# Patient Record
Sex: Female | Born: 1985 | Race: White | Hispanic: No | Marital: Single | Smoking: Former smoker
Health system: Southern US, Community
[De-identification: ages and names within clinical notes are randomized; demographics above are authoritative.]

## PROBLEM LIST (undated history)

## (undated) DIAGNOSIS — I429 Cardiomyopathy, unspecified: Secondary | ICD-10-CM

## (undated) DIAGNOSIS — I10 Essential (primary) hypertension: Secondary | ICD-10-CM

## (undated) HISTORY — PX: OTHER SURGICAL HISTORY: SHX169

## (undated) HISTORY — PX: CARDIAC ELECTROPHYSIOLOGY MAPPING AND ABLATION: SHX1292

---

## 2016-06-11 ENCOUNTER — Other Ambulatory Visit (HOSPITAL_COMMUNITY): Payer: Self-pay | Admitting: Obstetrics and Gynecology

## 2016-06-11 DIAGNOSIS — Z3689 Encounter for other specified antenatal screening: Secondary | ICD-10-CM

## 2016-06-11 DIAGNOSIS — Z3A25 25 weeks gestation of pregnancy: Secondary | ICD-10-CM

## 2016-06-19 ENCOUNTER — Encounter (HOSPITAL_COMMUNITY): Payer: Self-pay

## 2016-06-19 ENCOUNTER — Ambulatory Visit (HOSPITAL_COMMUNITY)
Admission: RE | Admit: 2016-06-19 | Discharge: 2016-06-19 | Disposition: A | Discharge status: Home / Self Care | Payer: Medicaid Other | Source: Ambulatory Visit | Attending: Obstetrics and Gynecology | Admitting: Obstetrics and Gynecology

## 2016-06-19 DIAGNOSIS — Z3A25 25 weeks gestation of pregnancy: Secondary | ICD-10-CM

## 2016-06-19 DIAGNOSIS — Z36 Encounter for antenatal screening of mother: Secondary | ICD-10-CM | POA: Insufficient documentation

## 2016-06-19 DIAGNOSIS — Z3689 Encounter for other specified antenatal screening: Secondary | ICD-10-CM

## 2016-06-19 DIAGNOSIS — O359XX0 Maternal care for (suspected) fetal abnormality and damage, unspecified, not applicable or unspecified: Secondary | ICD-10-CM | POA: Insufficient documentation

## 2016-06-19 HISTORY — DX: Cardiomyopathy, unspecified: I42.9

## 2016-06-19 HISTORY — DX: Essential (primary) hypertension: I10

## 2016-06-29 ENCOUNTER — Other Ambulatory Visit (HOSPITAL_COMMUNITY): Payer: Self-pay

## 2016-06-29 ENCOUNTER — Telehealth (HOSPITAL_COMMUNITY): Payer: Self-pay | Admitting: MS"

## 2016-06-29 NOTE — Telephone Encounter (Signed)
Called Alexa Olson to discuss her prenatal cell free DNA test results.  Ms. Alexa Olson had [facility] testing through [facility].  Testing was offered because of ultrasound finding.   The patient was identified by name and DOB.  We reviewed that these are within normal limits, showing a less than 1 in 10,000 risk for trisomies 21, 18 and 13, and monosomy X (Turner syndrome).  In addition, the risk for triploidy/vanishing twin and sex chromosome trisomies (47,XXX and 47,XXY) was also low risk. We reviewed that this testing identifies > 99% of pregnancies with trisomy 4721, trisomy 913, sex chromosome trisomies (47,XXX and 47,XXY), and triploidy. The detection rate for trisomy 18 is 96%.  The detection rate for monosomy X is ~92%.  The false positive rate is <0.1% for all conditions. Testing was also consistent with female fetal sex. She understands that this testing does not identify all genetic conditions.  All questions were answered to her satisfaction, she was encouraged to call with additional questions or concerns.  Quinn PlowmanKaren Turon Kilmer, MS Certified Genetic Counselor 06/29/2016 9:47 AM

## 2016-07-31 ENCOUNTER — Encounter (HOSPITAL_COMMUNITY): Payer: Self-pay | Admitting: Obstetrics and Gynecology

## 2016-07-31 ENCOUNTER — Other Ambulatory Visit (HOSPITAL_COMMUNITY): Payer: Self-pay | Admitting: Obstetrics and Gynecology

## 2016-07-31 DIAGNOSIS — Z3A31 31 weeks gestation of pregnancy: Secondary | ICD-10-CM

## 2016-07-31 DIAGNOSIS — O359XX0 Maternal care for (suspected) fetal abnormality and damage, unspecified, not applicable or unspecified: Secondary | ICD-10-CM

## 2016-08-07 ENCOUNTER — Encounter (HOSPITAL_COMMUNITY): Payer: Self-pay

## 2016-08-07 ENCOUNTER — Ambulatory Visit (HOSPITAL_COMMUNITY)
Admission: RE | Admit: 2016-08-07 | Discharge: 2016-08-07 | Disposition: A | Discharge status: Home / Self Care | Payer: Medicaid Other | Source: Ambulatory Visit | Attending: Obstetrics and Gynecology | Admitting: Obstetrics and Gynecology

## 2016-08-07 DIAGNOSIS — Z3A31 31 weeks gestation of pregnancy: Secondary | ICD-10-CM

## 2016-08-07 DIAGNOSIS — O359XX0 Maternal care for (suspected) fetal abnormality and damage, unspecified, not applicable or unspecified: Secondary | ICD-10-CM

## 2016-08-07 DIAGNOSIS — O358XX0 Maternal care for other (suspected) fetal abnormality and damage, not applicable or unspecified: Secondary | ICD-10-CM | POA: Diagnosis present

## 2016-08-13 ENCOUNTER — Encounter (HOSPITAL_COMMUNITY): Payer: Self-pay

## 2016-08-13 ENCOUNTER — Other Ambulatory Visit (HOSPITAL_COMMUNITY): Payer: Self-pay

## 2017-04-24 ENCOUNTER — Encounter (HOSPITAL_COMMUNITY): Payer: Self-pay

## 2018-04-02 IMAGING — US US MFM OB FOLLOW-UP
1 series · 14 of 28 positions shown · non-contrast
Comparison: none

[Series 1: us mfm ob follow-up · 56 acquisitions, 14 frames shown]
[im 3/56]
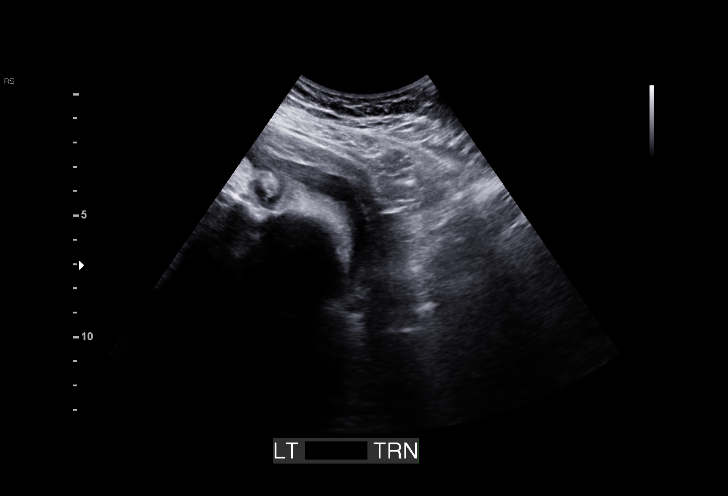
[im 7/56]
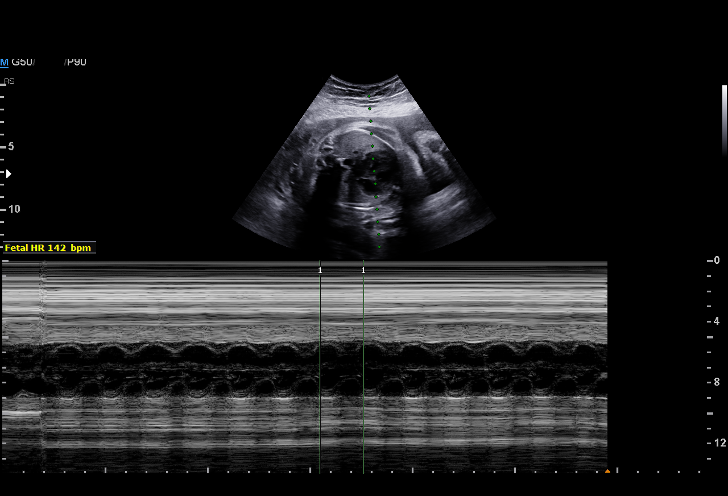
[im 11/56]
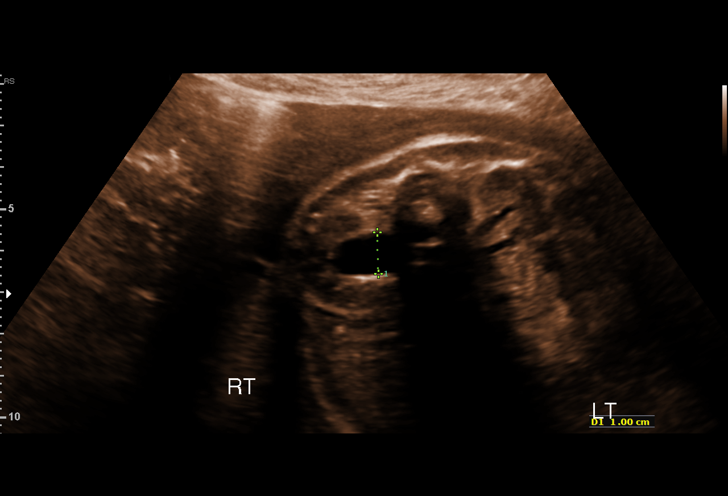
[im 15/56]
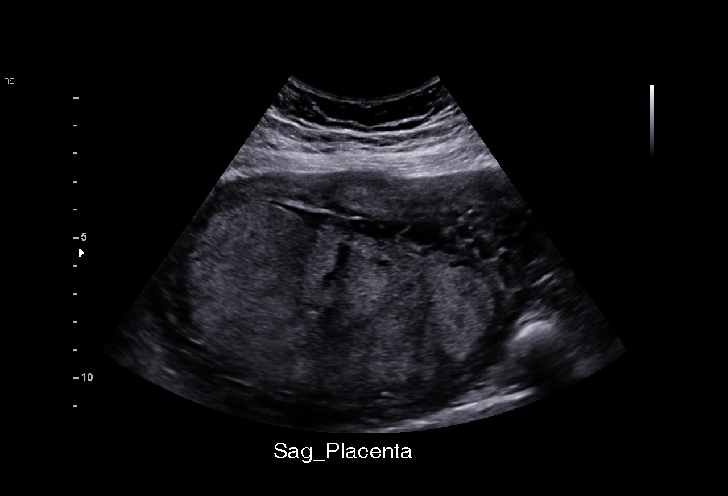
[im 19/56]
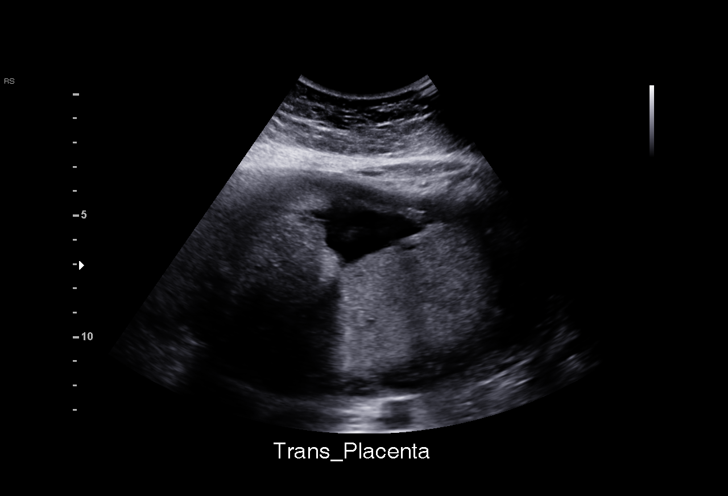
[im 23/56]
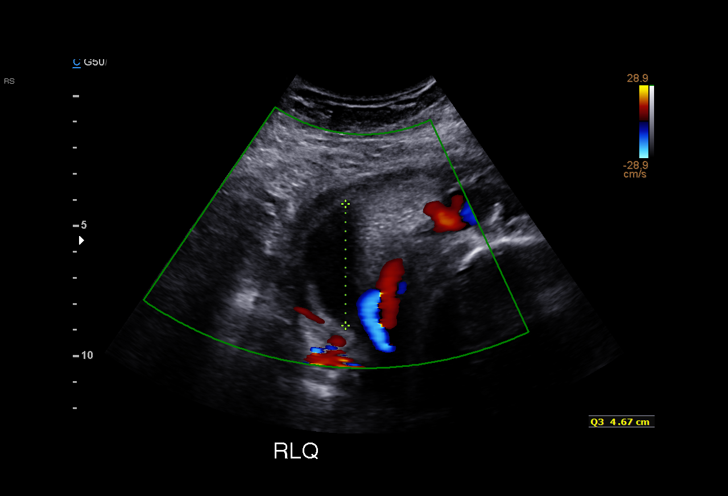
[im 27/56]
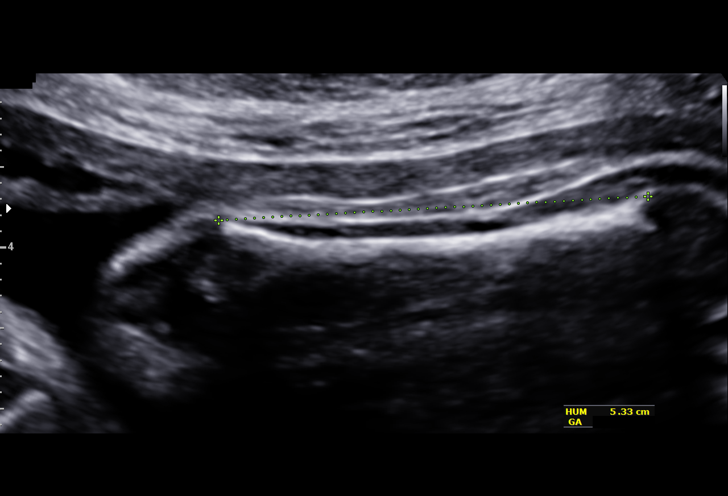
[im 31/56]
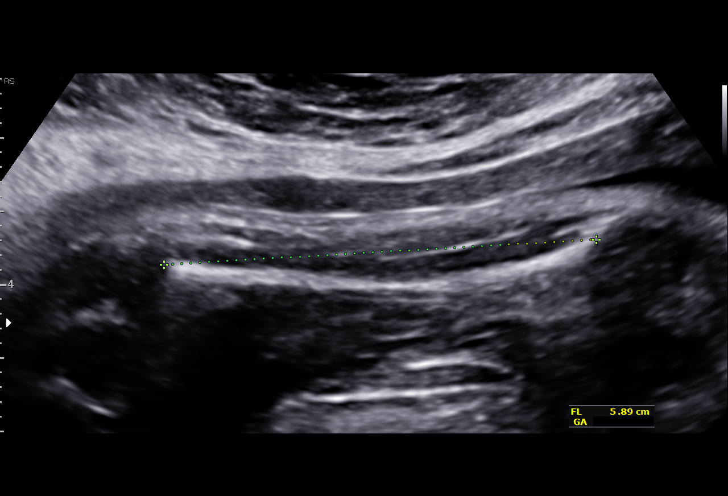
[im 35/56]
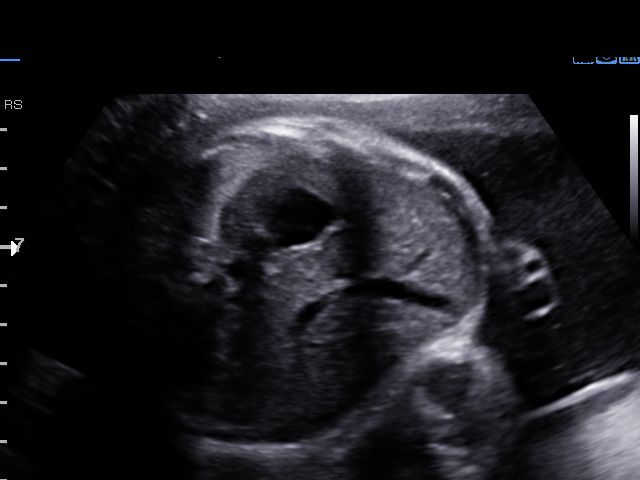
[im 39/56]
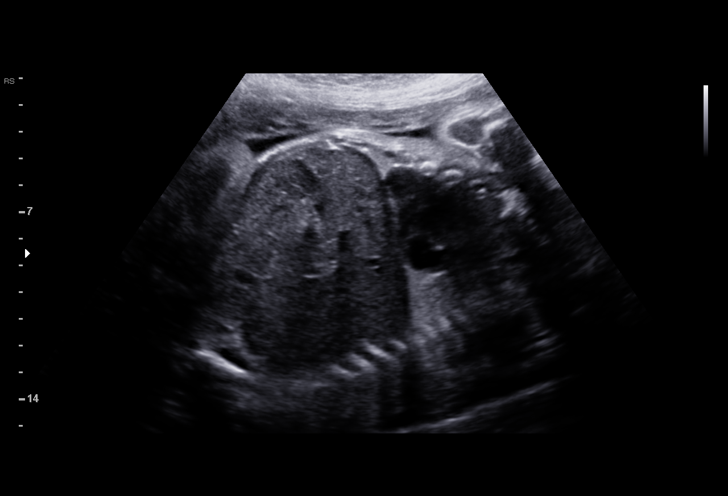
[im 43/56]
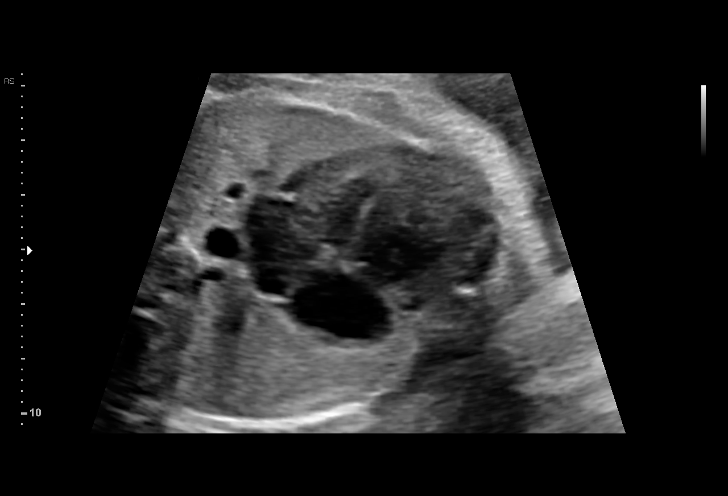
[im 47/56]
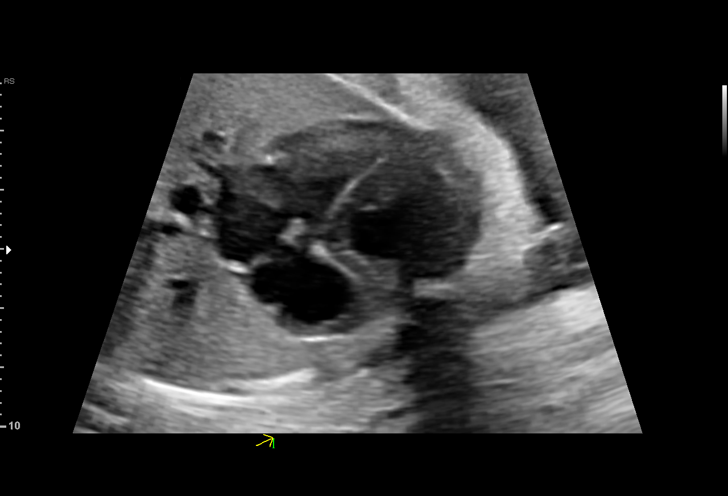
[im 51/56]
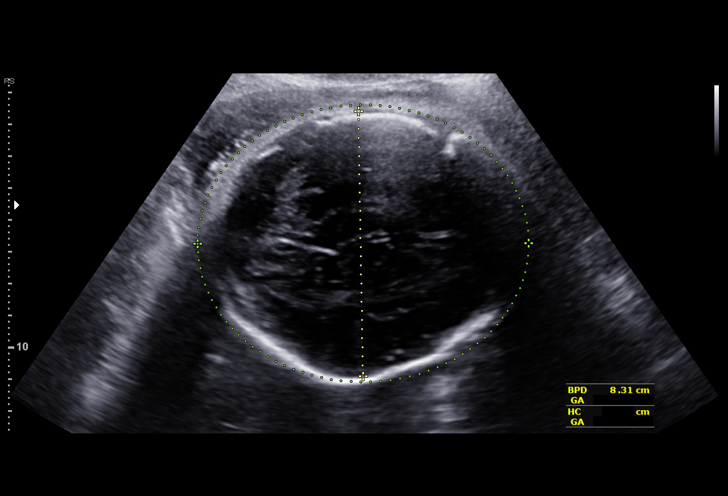
[im 56/56]
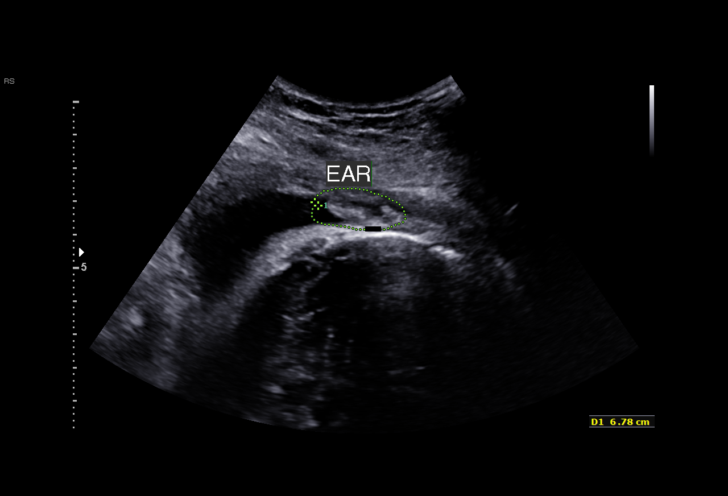

[14 of 28 positions shown; findings below may reference images not displayed]

MORUT DO

Indications

32 weeks gestation of pregnancy
Fetal abnormality - other known or
suspected (renal pyelectasis)
OB History

Gravidity:    2         Term:   1        Prem:   0        SAB:   0
TOP:          0       Ectopic:  0        Living: 1
Fetal Evaluation

Num Of Fetuses:     1
Fetal Heart         142
Rate(bpm):
Cardiac Activity:   Observed
Presentation:       Cephalic
Placenta:           Posterior, above cervical os
P. Cord Insertion:  Previously Visualized

Amniotic Fluid
AFI FV:      Subjectively within normal limits

AFI Sum(cm)     %Tile       Largest Pocket(cm)
17.01           62

RUQ(cm)       RLQ(cm)       LUQ(cm)        LLQ(cm)
4.42
Biometry

BPD:      82.1  mm     G. Age:  33w 0d         68  %    CI:        74.16   %    70 - 86
FL/HC:      19.6   %    19.1 -
HC:      302.7  mm     G. Age:  33w 4d         53  %    HC/AC:      1.05        0.96 -
AC:      287.9  mm     G. Age:  32w 6d         68  %    FL/BPD:     72.2   %    71 - 87
FL:       59.3  mm     G. Age:  30w 6d         11  %    FL/AC:      20.6   %    20 - 24
HUM:      53.6  mm     G. Age:  31w 1d         33  %

Est. FW:    7639  gm      4 lb 5 oz     60  %
Gestational Age

LMP:           32w 1d        Date:  12/26/15                 EDD:   10/01/16
U/S Today:     32w 4d                                        EDD:   09/28/16
Best:          32w 1d     Det. By:  LMP  (12/26/15)          EDD:   10/01/16
Anatomy

Cranium:               Appears normal         Aortic Arch:            Previously seen
Cavum:                 Previously seen        Ductal Arch:            Previously seen
Ventricles:            Appears normal         Diaphragm:              Appears normal
Choroid Plexus:        Previously seen        Stomach:                Appears normal, left
sided
Cerebellum:            Previously seen        Abdomen:                Appears normal
Posterior Fossa:       Previously seen        Abdominal Wall:         Previously seen
Nuchal Fold:           Not applicable (>20    Cord Vessels:           Previously seen
wks GA)
Face:                  Orbits and profile     Kidneys:                Right sided
previously seen
pyelectasis,10
mm
Lips:                  Previously seen        Bladder:                Appears normal
Thoracic:              Appears normal         Spine:                  Previously seen
Heart:                 Appears normal         Upper Extremities:      Previously seen
(4CH, axis, and situs
RVOT:                  Previously seen        Lower Extremities:      Previously seen
LVOT:                  Previously seen

Other:  Fetus appears to be a female.
Cervix Uterus Adnexa

Cervix
Not visualized (advanced GA >15wks)

Uterus
No abnormality visualized.

Left Ovary
Not visualized.

Right Ovary
Not visualized.

Adnexa:       No abnormality visualized. No adnexal mass
visualized.
Comments

accompanying documentation for further details.
Impression

Single living intrauterine pregnancy at 32 weeks 1 day.
Appropriate interval fetal growth (60%).
Normal amniotic fluid volume.
Right sided urinary tract dilation class UTD1 (10 mm).
Recommendations

Follow-up ultrasounds as clinically indicated.

## 2022-09-26 ENCOUNTER — Encounter: Payer: Self-pay | Admitting: Podiatry

## 2022-09-26 ENCOUNTER — Ambulatory Visit (INDEPENDENT_AMBULATORY_CARE_PROVIDER_SITE_OTHER): Payer: Medicaid Other

## 2022-09-26 ENCOUNTER — Ambulatory Visit (INDEPENDENT_AMBULATORY_CARE_PROVIDER_SITE_OTHER): Payer: Medicaid Other | Admitting: Podiatry

## 2022-09-26 DIAGNOSIS — L600 Ingrowing nail: Secondary | ICD-10-CM

## 2022-09-26 DIAGNOSIS — M722 Plantar fascial fibromatosis: Secondary | ICD-10-CM | POA: Diagnosis not present

## 2022-09-26 MED ORDER — MELOXICAM 15 MG PO TABS: 15.0000 mg | ORAL_TABLET | Freq: Every day | ORAL | 0 refills | Status: AC | PRN

## 2022-09-26 NOTE — Progress Notes (Signed)
Subjective:   Patient ID: Alexa Olson, female   DOB: 36 y.o.   MRN: [id number]   HPI Chief Complaint  Patient presents with   Ingrown Toenail    Right foot lateral border red, swelling, painful onset about 1 month ago. Has been soaking in epsom salt and PCP gave Rx for antibiotic patient currently taking Rx as directed    Plantar Fasciitis    Left heel pain onset about 3 months ago. PCP states it is plantar fasciitis     36 year old female presents with above concerns.  Her main concern is she has an ingrown toenail the right lateral nail border which has been ongoing for 1 month.  Her primary care doctor has started her on antibiotics and is getting better but still having tenderness.  It is still somewhat red.  No red streaks.  Is also been having left heel pain for about 3 months.  In the bottom of the heel.  She tried stretching and ice without significant improvement.  No injuries were started.  Review of Systems  All other systems reviewed and are negative.  Past Medical History:  Diagnosis Date   Cardiomyopathy ([place])    Hypertension     Past Surgical History:  Procedure Laterality Date   anal spinctor reconstruction     CARDIAC ELECTROPHYSIOLOGY MAPPING AND ABLATION     perineal reconstruction       Current Outpatient Medications:    meloxicam (MOBIC) 15 MG tablet, Take 1 tablet (15 mg total) by mouth daily as needed for pain., Disp: 30 tablet, Rfl: 0   acetaminophen (TYLENOL) 325 MG tablet, Take 650 mg by mouth every 6 (six) hours as needed., Disp: , Rfl:    Pantoprazole Sodium (PROTONIX PO), Take by mouth., Disp: , Rfl:    Prenatal Vit w/Fe-Methylfol-FA (PNV PO), Take by mouth., Disp: , Rfl:   Allergies  Allergen Reactions   Penicillins Hives        Objective:  Physical Exam  General: AAO x3, NAD  Dermatological: Incurvation present to right lateral nail border with localized edema and erythema but no drainage or pus noted today.  No ascending cellulitis.   There is no fluctuation or crepitation there is no malodor.  Vascular: Dorsalis Pedis artery and Posterior Tibial artery pedal pulses are 2/4 bilateral with immedate capillary fill time. There is no pain with calf compression, swelling, warmth, erythema.   Neruologic: Grossly intact via light touch bilateral.Negative tinel sign.  Musculoskeletal: Tenderness to palpation along the plantar medial tubercle of the calcaneus at the insertion of plantar fascia on the left foot. There is no pain along the course of the plantar fascia within the arch of the foot. Plantar fascia appears to be intact. There is no pain with lateral compression of the calcaneus or pain with vibratory sensation. There is no pain along the course or insertion of the achilles tendon. No other areas of tenderness to bilateral lower extremities. Muscular strength 5/5 in all groups tested bilateral.  Gait: Unassisted, Nonantalgic.       Assessment:   Right lateral hallux ingrown toenail, left plantar fasciitis      Plan:  -Treatment options discussed including all alternatives, risks, and complications -Etiology of symptoms were discussed -At this time, the patient is requesting partial nail removal with chemical matricectomy to the symptomatic portion of the nail. Risks and complications were discussed with the patient for which they understand and written consent was obtained. Under sterile conditions a total of 3 mL  of a mixture of 2% lidocaine plain and 0.5% Marcaine plain was infiltrated in a hallux block fashion. Once anesthetized, the skin was prepped in sterile fashion. A tourniquet was then applied. Next the right lateral aspect of hallux nail border was then sharply excised making sure to remove the entire offending nail border. Once the nails were ensured to be removed area was debrided and the underlying skin was intact. There is no purulence identified in the procedure. Next phenol was then applied under standard  conditions and copiously irrigated. Silvadene was applied. A dry sterile dressing was applied. After application of the dressing the tourniquet was removed and there is found to be an immediate capillary refill time to the digit. The patient tolerated the procedure well any complications. Post procedure instructions were discussed the patient for which he verbally understood. Follow-up in one week for nail check or sooner if any problems are to arise. Discussed signs/symptoms of infection and directed to call the office immediately should any occur or go directly to the emergency room. In the meantime, encouraged to call the office with any questions, concerns, changes symptoms. -Finish course of antibiotics     -X-rays were obtained and reviewed with the patient.  Small calcaneal spurs present.  No evidence of acute fracture. -Prescribed mobic. Discussed side effects of the medication and directed to stop if any are to occur and call the office.  -Discussed traction, icing daily.  Plantar fascia which was dispensed to help support the plantar fascia.  Medicaid paperwork was completed for insurance.  Continue supportive shoe gear.  If needed consider steroid injection.

## 2022-09-26 NOTE — Patient Instructions (Addendum)
Place 1/4 cup of epsom salts in a quart of warm tap water.  Submerge your foot or feet in the solution and soak for 20 minutes.  This soak should be done twice a day.  Next, remove your foot or feet from solution, blot dry the affected area. Apply ointment and cover if instructed by your doctor.   IF YOUR SKIN BECOMES IRRITATED WHILE USING THESE INSTRUCTIONS, IT IS OKAY TO SWITCH TO  WHITE VINEGAR AND WATER.  As another alternative soak, you may use antibacterial soap and water.  Monitor for any signs/symptoms of infection. Call the office immediately if any occur or go directly to the emergency room. Call with any questions/concerns.   Plantar Fasciitis (Heel Spur Syndrome) with Rehab The plantar fascia is a fibrous, ligament-like, soft-tissue structure that spans the bottom of the foot. Plantar fasciitis is a condition that causes pain in the foot due to inflammation of the tissue. SYMPTOMS  Pain and tenderness on the underneath side of the foot. Pain that worsens with standing or walking. CAUSES  Plantar fasciitis is caused by irritation and injury to the plantar fascia on the underneath side of the foot. Common mechanisms of injury include: Direct trauma to bottom of the foot. Damage to a small nerve that runs under the foot where the main fascia attaches to the heel bone. Stress placed on the plantar fascia due to bone spurs. RISK INCREASES WITH:  Activities that place stress on the plantar fascia (running, jumping, pivoting, or cutting). Poor strength and flexibility. Improperly fitted shoes. Tight calf muscles. Flat feet. Failure to warm-up properly before activity. Obesity. PREVENTION Warm up and stretch properly before activity. Allow for adequate recovery between workouts. Maintain physical fitness: Strength, flexibility, and endurance. Cardiovascular fitness. Maintain a health body weight. Avoid stress on the plantar fascia. Wear properly fitted shoes, including arch  supports for individuals who have flat feet.  PROGNOSIS  If treated properly, then the symptoms of plantar fasciitis usually resolve without surgery. However, occasionally surgery is necessary.  RELATED COMPLICATIONS  Recurrent symptoms that may result in a chronic condition. Problems of the lower back that are caused by compensating for the injury, such as limping. Pain or weakness of the foot during push-off following surgery. Chronic inflammation, scarring, and partial or complete fascia tear, occurring more often from repeated injections.  TREATMENT  Treatment initially involves the use of ice and medication to help reduce pain and inflammation. The use of strengthening and stretching exercises may help reduce pain with activity, especially stretches of the Achilles tendon. These exercises may be performed at home or with a therapist. Your caregiver may recommend that you use heel cups of arch supports to help reduce stress on the plantar fascia. Occasionally, corticosteroid injections are given to reduce inflammation. If symptoms persist for greater than 6 months despite non-surgical (conservative), then surgery may be recommended.   MEDICATION  If pain medication is necessary, then nonsteroidal anti-inflammatory medications, such as aspirin and ibuprofen, or other minor pain relievers, such as acetaminophen, are often recommended. Do not take pain medication within 7 days before surgery. Prescription pain relievers may be given if deemed necessary by your caregiver. Use only as directed and only as much as you need. Corticosteroid injections may be given by your caregiver. These injections should be reserved for the most serious cases, because they may only be given a certain number of times.  HEAT AND COLD Cold treatment (icing) relieves pain and reduces inflammation. Cold treatment should be applied   for 10 to 15 minutes every 2 to 3 hours for inflammation and pain and immediately after  any activity that aggravates your symptoms. Use ice packs or massage the area with a piece of ice (ice massage). Heat treatment may be used prior to performing the stretching and strengthening activities prescribed by your caregiver, physical therapist, or athletic trainer. Use a heat pack or soak the injury in warm water.  SEEK IMMEDIATE MEDICAL CARE IF: Treatment seems to offer no benefit, or the condition worsens. Any medications produce adverse side effects.  EXERCISES- RANGE OF MOTION (ROM) AND STRETCHING EXERCISES - Plantar Fasciitis (Heel Spur Syndrome) These exercises may help you when beginning to rehabilitate your injury. Your symptoms may resolve with or without further involvement from your physician, physical therapist or athletic trainer. While completing these exercises, remember:  Restoring tissue flexibility helps normal motion to return to the joints. This allows healthier, less painful movement and activity. An effective stretch should be held for at least 30 seconds. A stretch should never be painful. You should only feel a gentle lengthening or release in the stretched tissue.  RANGE OF MOTION - Toe Extension, Flexion Sit with your right / left leg crossed over your opposite knee. Grasp your toes and gently pull them back toward the top of your foot. You should feel a stretch on the bottom of your toes and/or foot. Hold this stretch for 10 seconds. Now, gently pull your toes toward the bottom of your foot. You should feel a stretch on the top of your toes and or foot. Hold this stretch for 10 seconds. Repeat  times. Complete this stretch 3 times per day.   RANGE OF MOTION - Ankle Dorsiflexion, Active Assisted Remove shoes and sit on a chair that is preferably not on a carpeted surface. Place right / left foot under knee. Extend your opposite leg for support. Keeping your heel down, slide your right / left foot back toward the chair until you feel a stretch at your ankle  or calf. If you do not feel a stretch, slide your bottom forward to the edge of the chair, while still keeping your heel down. Hold this stretch for 10 seconds. Repeat 3 times. Complete this stretch 2 times per day.   STRETCH  Gastroc, Standing Place hands on wall. Extend right / left leg, keeping the front knee somewhat bent. Slightly point your toes inward on your back foot. Keeping your right / left heel on the floor and your knee straight, shift your weight toward the wall, not allowing your back to arch. You should feel a gentle stretch in the right / left calf. Hold this position for 10 seconds. Repeat 3 times. Complete this stretch 2 times per day.  STRETCH  Soleus, Standing Place hands on wall. Extend right / left leg, keeping the other knee somewhat bent. Slightly point your toes inward on your back foot. Keep your right / left heel on the floor, bend your back knee, and slightly shift your weight over the back leg so that you feel a gentle stretch deep in your back calf. Hold this position for 10 seconds. Repeat 3 times. Complete this stretch 2 times per day.  STRETCH  Gastrocsoleus, Standing  Note: This exercise can place a lot of stress on your foot and ankle. Please complete this exercise only if specifically instructed by your caregiver.  Place the ball of your right / left foot on a step, keeping your other foot firmly on the   the same step. Hold on to the wall or a rail for balance. Slowly lift your other foot, allowing your body weight to press your heel down over the edge of the step. You should feel a stretch in your right / left calf. Hold this position for 10 seconds. Repeat this exercise with a slight bend in your right / left knee. Repeat 3 times. Complete this stretch 2 times per day.   STRENGTHENING EXERCISES - Plantar Fasciitis (Heel Spur Syndrome)  These exercises may help you when beginning to rehabilitate your injury. They may resolve your symptoms with or  without further involvement from your physician, physical therapist or athletic trainer. While completing these exercises, remember:  Muscles can gain both the endurance and the strength needed for everyday activities through controlled exercises. Complete these exercises as instructed by your physician, physical therapist or athletic trainer. Progress the resistance and repetitions only as guided.  STRENGTH - Towel Curls Sit in a chair positioned on a non-carpeted surface. Place your foot on a towel, keeping your heel on the floor. Pull the towel toward your heel by only curling your toes. Keep your heel on the floor. Repeat 3 times. Complete this exercise 2 times per day.  STRENGTH - Ankle Inversion Secure one end of a rubber exercise band/tubing to a fixed object (table, pole). Loop the other end around your foot just before your toes. Place your fists between your knees. This will focus your strengthening at your ankle. Slowly, pull your big toe up and in, making sure the band/tubing is positioned to resist the entire motion. Hold this position for 10 seconds. Have your muscles resist the band/tubing as it slowly pulls your foot back to the starting position. Repeat 3 times. Complete this exercises 2 times per day.  Document Released: 11/30/2005 Document Revised: 02/22/2012 Document Reviewed: 03/14/2009 [facility] Patient Information 2014 [place], [place]. Meloxicam Tablets What is this medication? MELOXICAM (mel OX i cam) treats mild to moderate pain, inflammation, or arthritis. It works by decreasing inflammation. It belongs to a group of medications called NSAIDs. This medicine may be used for other purposes; ask your health care provider or pharmacist if you have questions. COMMON BRAND NAME(S): Mobic What should I tell my care team before I take this medication? They need to know if you have any of these conditions: Asthma (lung or breathing disease) Bleeding disorder Coronary  artery bypass graft (CABG) within the past 2 weeks Dehydration Heart attack Heart disease Heart failure High blood pressure If you often drink alcohol Kidney disease Liver disease Smoke tobacco cigarettes Stomach bleeding Stomach ulcers, other stomach or intestine problems Take medications that treat or prevent blood clots Taking other steroids like dexamethasone or prednisone An unusual or allergic reaction to meloxicam, other medications, foods, dyes, or preservatives Pregnant or trying to get pregnant Breast-feeding How should I use this medication? Take this medication by mouth. Take it as directed on the prescription label at the same time every day. You can take it with or without food. If it upsets your stomach, take it with food. Do not use it more often than directed. There may be unused or extra doses in the bottle after you finish your treatment. Talk to your care team if you have questions about your dose. A special MedGuide will be given to you by the pharmacist with each prescription and refill. Be sure to read this information carefully each time. Talk to your care team about the use of this medication in children.  Special care may be needed. Patients over 68 years of age may have a stronger reaction and need a smaller dose. Overdosage: If you think you have taken too much of this medicine contact a poison control center or emergency room at once. NOTE: This medicine is only for you. Do not share this medicine with others. What if I miss a dose? If you miss a dose, take it as soon as you can. If it is almost time for your next dose, take only that dose. Do not take double or extra doses. What may interact with this medication? Do not take this medication with any of the following: Cidofovir Ketorolac This medication may also interact with the following: Aspirin and aspirin-like medications Certain medications for blood pressure, heart disease, irregular heart  beat Certain medications for depression, anxiety, or psychotic disturbances Certain medications that treat or prevent blood clots like warfarin, enoxaparin, dalteparin, apixaban, dabigatran, rivaroxaban Cyclosporine Diuretics Fluconazole Lithium Methotrexate Other NSAIDs, medications for pain and inflammation, like ibuprofen and naproxen Pemetrexed This list may not describe all possible interactions. Give your health care provider a list of all the medicines, herbs, non-prescription drugs, or dietary supplements you use. Also tell them if you smoke, drink alcohol, or use illegal drugs. Some items may interact with your medicine. What should I watch for while using this medication? Visit your care team for regular checks on your progress. Tell your care team if your symptoms do not start to get better or if they get worse. Do not take other medications that contain aspirin, ibuprofen, or naproxen with this medication. Side effects such as stomach upset, nausea, or ulcers may be more likely to occur. Many non-prescription medications contain aspirin, ibuprofen, or naproxen. Always read labels carefully. This medication can cause serious ulcers and bleeding in the stomach. It can happen with no warning. Smoking, drinking alcohol, older age, and poor health can also increase risks. Call your care team right away if you have stomach pain or blood in your vomit or stool. This medication does not prevent a heart attack or stroke. This medication may increase the chance of a heart attack or stroke. The chance may increase the longer you use this medication or if you have heart disease. If you take aspirin to prevent a heart attack or stroke, talk to your care team about using this medication. Alcohol may interfere with the effect of this medication. Avoid alcoholic drinks. This medication may cause serious skin reactions. They can happen weeks to months after starting the medication. Contact your care team  right away if you notice fevers or flu-like symptoms with a rash. The rash may be red or purple and then turn into blisters or peeling of the skin. Or, you might notice a red rash with swelling of the face, lips or lymph nodes in your neck or under your arms. Talk to your care team if you are pregnant before taking this medication. Taking this medication between weeks 20 and 30 of pregnancy may harm your unborn baby. Your care team will monitor you closely if you need to take it. After 30 weeks of pregnancy, do not take this medication. You may get drowsy or dizzy. Do not drive, use machinery, or do anything that needs mental alertness until you know how this medication affects you. Do not stand up or sit up quickly, especially if you are an older patient. This reduces the risk of dizzy or fainting spells. Be careful brushing or flossing your teeth or  using a toothpick because you may get an infection or bleed more easily. If you have any dental work done, tell your dentist you are receiving this medication. This medication may make it more difficult to get pregnant. Talk to your care team if you are concerned about your fertility. What side effects may I notice from receiving this medication? Side effects that you should report to your care team as soon as possible: Allergic reactions--skin rash, itching, hives, swelling of the face, lips, tongue, or throat Bleeding--bloody or black, tar-like stools, vomiting blood or brown material that looks like coffee grounds, red or dark brown urine, small red or purple spots on skin, unusual bruising or bleeding Heart attack--pain or tightness in the chest, shoulders, arms, or jaw, nausea, shortness of breath, cold or clammy skin, feeling faint or lightheaded Heart failure--shortness of breath, swelling of ankles, feet, or hands, sudden weight gain, unusual weakness or fatigue Increase in blood pressure Kidney injury--decrease in the amount of urine, swelling of  the ankles, hands, or feet Liver injury--right upper belly pain, loss of appetite, nausea, light-colored stool, dark yellow or brown urine, yellowing skin or eyes, unusual weakness or fatigue Rash, fever, and swollen lymph nodes Redness, blistering, peeling, or loosening of the skin, including inside the mouth Stroke--sudden numbness or weakness of the face, arm, or leg, trouble speaking, confusion, trouble walking, loss of balance or coordination, dizziness, severe headache, change in vision Side effects that usually do not require medical attention (report to your care team if they continue or are bothersome): Diarrhea Nausea Upset stomach This list may not describe all possible side effects. Call your doctor for medical advice about side effects. You may report side effects to FDA at 1-800-FDA-1088. Where should I keep my medication? Keep out of the reach of children and pets. Store at room temperature between 20 and 25 degrees C (68 and 77 degrees F). Protect from moisture. Keep the container tightly closed. Get rid of any unused medication after the expiration date. To get rid of medications that are no longer needed or have expired: Take the medication to a medication take-back program. Check with your pharmacy or law enforcement to find a location. If you cannot return the medication, check the label or package insert to see if the medication should be thrown out in the garbage or flushed down the toilet. If you are not sure, ask your care team. If it is safe to put it in the trash, empty the medication out of the container. Mix the medication with cat litter, dirt, coffee grounds, or other unwanted substance. Seal the mixture in a bag or container. Put it in the trash. NOTE: This sheet is a summary. It may not cover all possible information. If you have questions about this medicine, talk to your doctor, pharmacist, or health care provider.  2023 [employer] Standard (2008-01-21  00:00:00)

## 2022-10-23 ENCOUNTER — Ambulatory Visit (INDEPENDENT_AMBULATORY_CARE_PROVIDER_SITE_OTHER): Payer: Medicaid Other | Admitting: Podiatry

## 2022-10-23 DIAGNOSIS — M722 Plantar fascial fibromatosis: Secondary | ICD-10-CM

## 2022-10-23 DIAGNOSIS — L600 Ingrowing nail: Secondary | ICD-10-CM

## 2022-10-23 MED ORDER — TRIAMCINOLONE ACETONIDE 10 MG/ML IJ SUSP
10.0000 mg | Freq: Once | INTRAMUSCULAR | Status: AC
  Administered 2022-10-23: 10 mg

## 2022-10-23 NOTE — Progress Notes (Unsigned)
Toe better, left heel hurting   Inj left, gel heel cup

## 2022-10-23 NOTE — Patient Instructions (Signed)
For inserts I like POWERSTEPS, SUPERFEET, [employer]  Plantar Fasciitis (Heel Spur Syndrome) with Rehab The plantar fascia is a fibrous, ligament-like, soft-tissue structure that spans the bottom of the foot. Plantar fasciitis is a condition that causes pain in the foot due to inflammation of the tissue. SYMPTOMS  Pain and tenderness on the underneath side of the foot. Pain that worsens with standing or walking. CAUSES  Plantar fasciitis is caused by irritation and injury to the plantar fascia on the underneath side of the foot. Common mechanisms of injury include: Direct trauma to bottom of the foot. Damage to a small nerve that runs under the foot where the main fascia attaches to the heel bone. Stress placed on the plantar fascia due to bone spurs. RISK INCREASES WITH:  Activities that place stress on the plantar fascia (running, jumping, pivoting, or cutting). Poor strength and flexibility. Improperly fitted shoes. Tight calf muscles. Flat feet. Failure to warm-up properly before activity. Obesity. PREVENTION Warm up and stretch properly before activity. Allow for adequate recovery between workouts. Maintain physical fitness: Strength, flexibility, and endurance. Cardiovascular fitness. Maintain a health body weight. Avoid stress on the plantar fascia. Wear properly fitted shoes, including arch supports for individuals who have flat feet.  PROGNOSIS  If treated properly, then the symptoms of plantar fasciitis usually resolve without surgery. However, occasionally surgery is necessary.  RELATED COMPLICATIONS  Recurrent symptoms that may result in a chronic condition. Problems of the lower back that are caused by compensating for the injury, such as limping. Pain or weakness of the foot during push-off following surgery. Chronic inflammation, scarring, and partial or complete fascia tear, occurring more often from repeated injections.  TREATMENT  Treatment initially involves  the use of ice and medication to help reduce pain and inflammation. The use of strengthening and stretching exercises may help reduce pain with activity, especially stretches of the Achilles tendon. These exercises may be performed at home or with a therapist. Your caregiver may recommend that you use heel cups of arch supports to help reduce stress on the plantar fascia. Occasionally, corticosteroid injections are given to reduce inflammation. If symptoms persist for greater than 6 months despite non-surgical (conservative), then surgery may be recommended.   MEDICATION  If pain medication is necessary, then nonsteroidal anti-inflammatory medications, such as aspirin and ibuprofen, or other minor pain relievers, such as acetaminophen, are often recommended. Do not take pain medication within 7 days before surgery. Prescription pain relievers may be given if deemed necessary by your caregiver. Use only as directed and only as much as you need. Corticosteroid injections may be given by your caregiver. These injections should be reserved for the most serious cases, because they may only be given a certain number of times.  HEAT AND COLD Cold treatment (icing) relieves pain and reduces inflammation. Cold treatment should be applied for 10 to 15 minutes every 2 to 3 hours for inflammation and pain and immediately after any activity that aggravates your symptoms. Use ice packs or massage the area with a piece of ice (ice massage). Heat treatment may be used prior to performing the stretching and strengthening activities prescribed by your caregiver, physical therapist, or athletic trainer. Use a heat pack or soak the injury in warm water.  SEEK IMMEDIATE MEDICAL CARE IF: Treatment seems to offer no benefit, or the condition worsens. Any medications produce adverse side effects.  EXERCISES- RANGE OF MOTION (ROM) AND STRETCHING EXERCISES - Plantar Fasciitis (Heel Spur Syndrome) These exercises may help    you when beginning to rehabilitate your injury. Your symptoms may resolve with or without further involvement from your physician, physical therapist or athletic trainer. While completing these exercises, remember:  Restoring tissue flexibility helps normal motion to return to the joints. This allows healthier, less painful movement and activity. An effective stretch should be held for at least 30 seconds. A stretch should never be painful. You should only feel a gentle lengthening or release in the stretched tissue.  RANGE OF MOTION - Toe Extension, Flexion Sit with your right / left leg crossed over your opposite knee. Grasp your toes and gently pull them back toward the top of your foot. You should feel a stretch on the bottom of your toes and/or foot. Hold this stretch for 10 seconds. Now, gently pull your toes toward the bottom of your foot. You should feel a stretch on the top of your toes and or foot. Hold this stretch for 10 seconds. Repeat  times. Complete this stretch 3 times per day.   RANGE OF MOTION - Ankle Dorsiflexion, Active Assisted Remove shoes and sit on a chair that is preferably not on a carpeted surface. Place right / left foot under knee. Extend your opposite leg for support. Keeping your heel down, slide your right / left foot back toward the chair until you feel a stretch at your ankle or calf. If you do not feel a stretch, slide your bottom forward to the edge of the chair, while still keeping your heel down. Hold this stretch for 10 seconds. Repeat 3 times. Complete this stretch 2 times per day.   STRETCH  Gastroc, Standing Place hands on wall. Extend right / left leg, keeping the front knee somewhat bent. Slightly point your toes inward on your back foot. Keeping your right / left heel on the floor and your knee straight, shift your weight toward the wall, not allowing your back to arch. You should feel a gentle stretch in the right / left calf. Hold this position  for 10 seconds. Repeat 3 times. Complete this stretch 2 times per day.  STRETCH  Soleus, Standing Place hands on wall. Extend right / left leg, keeping the other knee somewhat bent. Slightly point your toes inward on your back foot. Keep your right / left heel on the floor, bend your back knee, and slightly shift your weight over the back leg so that you feel a gentle stretch deep in your back calf. Hold this position for 10 seconds. Repeat 3 times. Complete this stretch 2 times per day.  STRETCH  Gastrocsoleus, Standing  Note: This exercise can place a lot of stress on your foot and ankle. Please complete this exercise only if specifically instructed by your caregiver.  Place the ball of your right / left foot on a step, keeping your other foot firmly on the same step. Hold on to the wall or a rail for balance. Slowly lift your other foot, allowing your body weight to press your heel down over the edge of the step. You should feel a stretch in your right / left calf. Hold this position for 10 seconds. Repeat this exercise with a slight bend in your right / left knee. Repeat 3 times. Complete this stretch 2 times per day.   STRENGTHENING EXERCISES - Plantar Fasciitis (Heel Spur Syndrome)  These exercises may help you when beginning to rehabilitate your injury. They may resolve your symptoms with or without further involvement from your physician, physical therapist or athletic trainer.   While completing these exercises, remember:  Muscles can gain both the endurance and the strength needed for everyday activities through controlled exercises. Complete these exercises as instructed by your physician, physical therapist or athletic trainer. Progress the resistance and repetitions only as guided.  STRENGTH - Towel Curls Sit in a chair positioned on a non-carpeted surface. Place your foot on a towel, keeping your heel on the floor. Pull the towel toward your heel by only curling your toes.  Keep your heel on the floor. Repeat 3 times. Complete this exercise 2 times per day.  STRENGTH - Ankle Inversion Secure one end of a rubber exercise band/tubing to a fixed object (table, pole). Loop the other end around your foot just before your toes. Place your fists between your knees. This will focus your strengthening at your ankle. Slowly, pull your big toe up and in, making sure the band/tubing is positioned to resist the entire motion. Hold this position for 10 seconds. Have your muscles resist the band/tubing as it slowly pulls your foot back to the starting position. Repeat 3 times. Complete this exercises 2 times per day.  Document Released: 11/30/2005 Document Revised: 02/22/2012 Document Reviewed: 03/14/2009 [employer] Patient Information 2014 [employer]
